# Patient Record
Sex: Male | Born: 1995 | Race: Black or African American | Hispanic: No | Marital: Single | Smoking: Current some day smoker
Health system: Southern US, Community
[De-identification: ages and names within clinical notes are randomized; demographics above are authoritative.]

---
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
## 2016-02-16 IMAGING — CR DG FOREARM 2V*L*
2 series · 2 of 2 positions shown · non-contrast
Comparison: None.

CLINICAL DATA: Pain post trauma

EXAM:
LEFT FOREARM - 2 VIEW

[x forearm ap left]
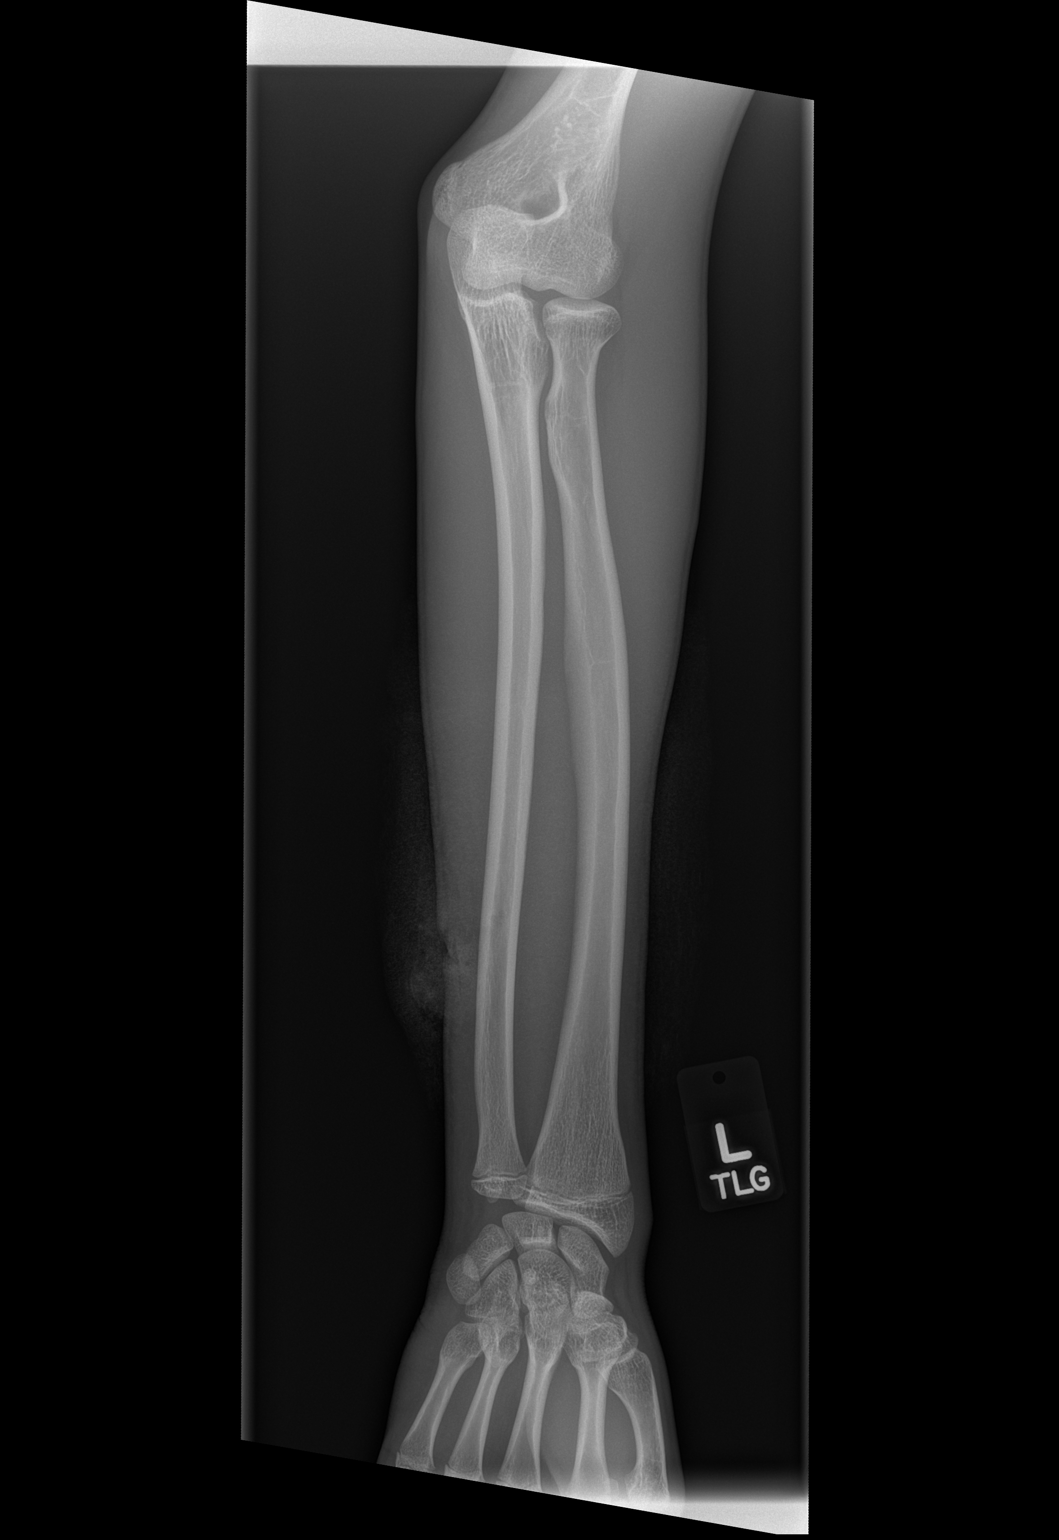

[x forearm lat left]
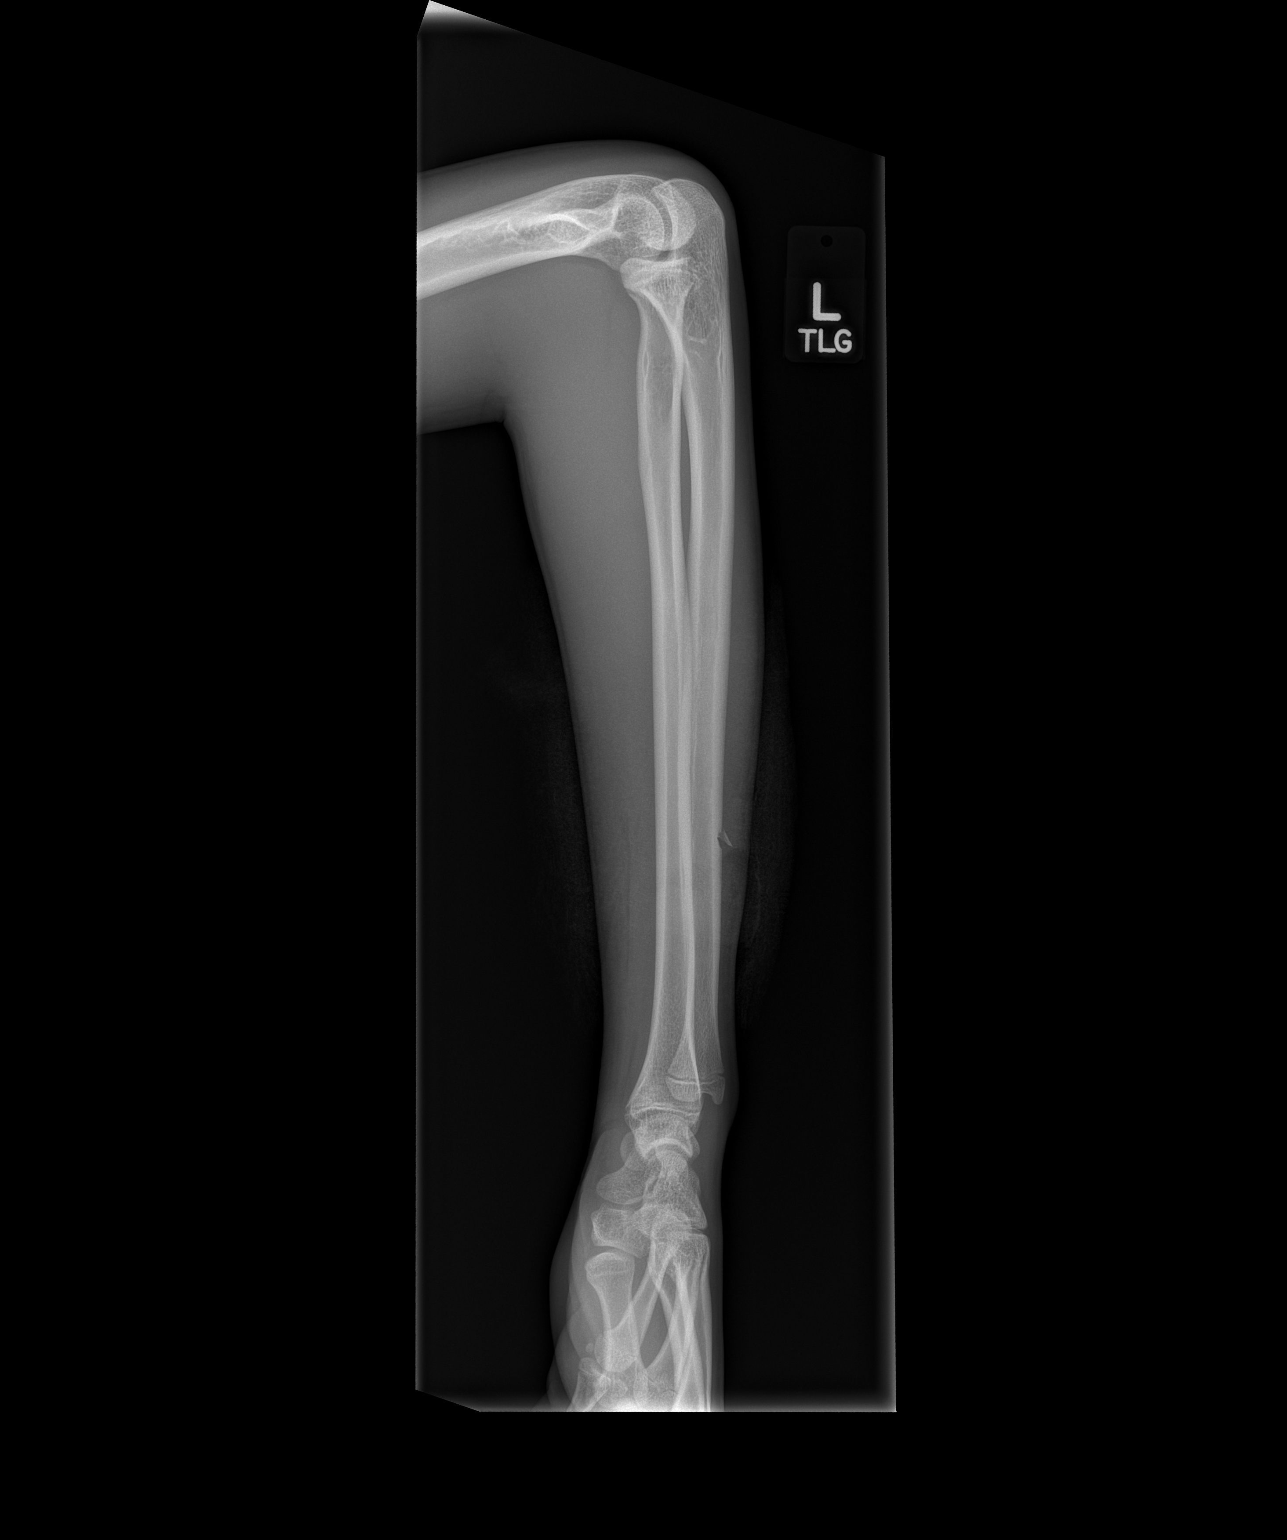

[2 of 2 positions shown; findings below may reference images not displayed]

FINDINGS: Frontal and lateral views were obtained. There is soft tissue injury
at the level of the distal ulnar diaphysis. There is a small
incomplete fracture along the dorsal aspect of the ulna at the
junction of the mid and distal thirds. No other fracture. No
dislocation. No radiopaque foreign body. Joint spaces appear intact.
IMPRESSION: Soft tissue injury over the distal ulna with an incomplete fracture
involving a portion of the cortex along the dorsal ulna near the
junction of the mid and distal thirds. No other fracture. No
dislocation.

## 2020-01-25 ENCOUNTER — Other Ambulatory Visit: Payer: Self-pay

## 2020-01-25 DIAGNOSIS — Z20822 Contact with and (suspected) exposure to covid-19: Secondary | ICD-10-CM

## 2020-01-26 LAB — SARS-COV-2, NAA 2 DAY TAT

## 2020-01-26 LAB — NOVEL CORONAVIRUS, NAA: SARS-CoV-2, NAA: NOT DETECTED

## 2021-04-04 ENCOUNTER — Ambulatory Visit (HOSPITAL_COMMUNITY)
Admission: EM | Admit: 2021-04-04 | Discharge: 2021-04-04 | Disposition: A | Discharge status: Home / Self Care | Payer: Self-pay

## 2021-04-04 ENCOUNTER — Other Ambulatory Visit: Payer: Self-pay

## 2021-04-04 DIAGNOSIS — M545 Low back pain, unspecified: Secondary | ICD-10-CM

## 2021-04-04 NOTE — ED Provider Notes (Signed)
[facility]    CSN: [id number] Arrival date & time: 04/04/21  1718      History   Chief Complaint Chief Complaint  Patient presents with   Work Note    HPI Ernest Alexander is a 25 y.o. male.   Pt reports he was experiencing lower back pain about two weeks ago.  Denies injury or trauma.  Denies radiation of pain, numbness, tingling.  He reports pain has completely resolved at this time.  He needs a note in order to return to work.  He is currently taking no medications.    No past medical history on file.  There are no problems to display for this patient.   No past surgical history on file.     Home Medications    Prior to Admission medications   Medication Sig Start Date End Date Taking? Authorizing Provider  cephALEXin (KEFLEX) 500 MG capsule Take 1 capsule (500 mg total) by mouth 4 (four) times daily. 12/29/13   Marcellina Millin, MD  ibuprofen (ADVIL,MOTRIN) 600 MG tablet Take 1 tablet (600 mg total) by mouth every 6 (six) hours as needed for mild pain. 12/29/13   Marcellina Millin, MD    Family History No family history on file.  Social History Social History   Tobacco Use   Smoking status: Some Days  Substance Use Topics   Alcohol use: Yes   Drug use: No    Types: Marijuana    Comment: past     Allergies   Patient has no known allergies.   Review of Systems Review of Systems  Constitutional:  Negative for chills and fever.  HENT:  Negative for ear pain and sore throat.   Eyes:  Negative for pain and visual disturbance.  Respiratory:  Negative for cough and shortness of breath.   Cardiovascular:  Negative for chest pain and palpitations.  Gastrointestinal:  Negative for abdominal pain and vomiting.  Genitourinary:  Negative for dysuria and hematuria.  Musculoskeletal:  Negative for arthralgias and back pain.  Skin:  Negative for color change and rash.  Neurological:  Negative for seizures and syncope.  All other systems reviewed and  are negative.   Physical Exam Triage Vital Signs ED Triage Vitals  Enc Vitals Group     BP 04/04/21 1821 121/80     Pulse Rate 04/04/21 1821 61     Resp 04/04/21 1821 18     Temp 04/04/21 1821 98 F (36.7 C)     Temp Source 04/04/21 1821 Oral     SpO2 04/04/21 1821 100 %     Weight --      Height --      Head Circumference --      Peak Flow --      Pain Score 04/04/21 1823 3     Pain Loc --      Pain Edu? --      Excl. in GC? --    No data found.  Updated Vital Signs BP 121/80 (BP Location: Left Arm)   Pulse 61   Temp 98 F (36.7 C) (Oral)   Resp 18   SpO2 100%   Visual Acuity Right Eye Distance:   Left Eye Distance:   Bilateral Distance:    Right Eye Near:   Left Eye Near:    Bilateral Near:     Physical Exam Vitals and nursing note reviewed.  Constitutional:      Appearance: He is well-developed.  HENT:  Head: Normocephalic and atraumatic.  Eyes:     Conjunctiva/sclera: Conjunctivae normal.  Cardiovascular:     Rate and Rhythm: Normal rate and regular rhythm.     Heart sounds: No murmur heard. Pulmonary:     Effort: Pulmonary effort is normal. No respiratory distress.     Breath sounds: Normal breath sounds.  Abdominal:     Palpations: Abdomen is soft.     Tenderness: There is no abdominal tenderness.  Musculoskeletal:     Cervical back: Neck supple.  Skin:    General: Skin is warm and dry.  Neurological:     Mental Status: He is alert.     UC Treatments / Results  Labs (all labs ordered are listed, but only abnormal results are displayed) Labs Reviewed - No data to display  EKG   Radiology No results found.  Procedures Procedures (including critical care time)  Medications Ordered in UC Medications - No data to display  Initial Impression / Assessment and Plan / UC Course  I have reviewed the triage vital signs and the nursing notes.  Pertinent labs & imaging results that were available during my care of the patient were  reviewed by me and considered in my medical decision making (see chart for details).     Back pain has resolved.  Pt is currently taking nothing for back pain.  He may return to work without restrictions. Return precautions discussed.  Final Clinical Impressions(s) / UC Diagnoses   Final diagnoses:  Acute low back pain without sciatica, unspecified back pain laterality     Discharge Instructions      May return to work without restrictions      ED Prescriptions   None    PDMP not reviewed this encounter.   Ward, Tylene Fantasia, PA-C 04/04/21 1849

## 2021-04-04 NOTE — ED Triage Notes (Signed)
Pt presents for note to return to work after being out for back pain.

## 2021-04-04 NOTE — Discharge Instructions (Signed)
May return to work without restrictions.

## 2021-05-05 ENCOUNTER — Encounter (HOSPITAL_COMMUNITY): Payer: Self-pay | Admitting: Emergency Medicine

## 2021-05-05 ENCOUNTER — Other Ambulatory Visit: Payer: Self-pay

## 2021-05-05 ENCOUNTER — Emergency Department (HOSPITAL_COMMUNITY)
Admission: EM | Admit: 2021-05-05 | Discharge: 2021-05-06 | Disposition: A | Discharge status: Home / Self Care | Payer: Self-pay | Attending: Emergency Medicine | Admitting: Emergency Medicine

## 2021-05-05 DIAGNOSIS — Z5321 Procedure and treatment not carried out due to patient leaving prior to being seen by health care provider: Secondary | ICD-10-CM | POA: Insufficient documentation

## 2021-05-05 DIAGNOSIS — R300 Dysuria: Secondary | ICD-10-CM | POA: Insufficient documentation

## 2021-05-05 LAB — URINALYSIS, ROUTINE W REFLEX MICROSCOPIC
Bacteria, UA: NONE SEEN
Bilirubin Urine: NEGATIVE
Glucose, UA: NEGATIVE mg/dL
Hgb urine dipstick: NEGATIVE
Ketones, ur: NEGATIVE mg/dL
Nitrite: NEGATIVE
Protein, ur: NEGATIVE mg/dL
Specific Gravity, Urine: 1.017 (ref 1.005–1.030)
WBC, UA: >50 WBC/hpf — ABNORMAL HIGH (ref 0–5)
pH: 7 (ref 5.0–8.0)

## 2021-05-05 LAB — HIV ANTIBODY (ROUTINE TESTING W REFLEX): HIV Screen 4th Generation wRfx: NONREACTIVE

## 2021-05-05 NOTE — ED Triage Notes (Addendum)
Patient requesting STD screening , reports dysuria this week and unprotected sexual encounter recently .

## 2021-05-05 NOTE — ED Notes (Signed)
PT has been called X4 for vitals . No answer. Will try again before removing  off floor

## 2021-05-05 NOTE — ED Provider Notes (Signed)
Emergency Medicine Provider Triage Evaluation Note  Ernest Alexander , a 25 y.o. male  was evaluated in triage.  Pt complains of dysuria.  Had 3 episodes of dysuria earlier today.  Patient reports that he is sexually active in a mutually monogamous relationship with a male partner, does not use condoms for penetrative vaginal sex.  Review of Systems  Positive: Dysuria Negative: Hematuria, urinary urgency, penile discharge, swelling or tenderness to genitals, genital sores or lesions, abdominal pain, nausea, vomiting, fever, chills  Physical Exam  BP 134/79 (BP Location: Left Arm)   Pulse 67   Temp 99.2 F (37.3 C) (Oral)   Resp 16   Ht 6\' 5"  (1.956 m)   Wt 95 kg   SpO2 100%   BMI 24.84 kg/m  Gen:   Awake, no distress   Resp:  Normal effort  MSK:   Moves extremities without difficulty  Other:  Abdomen soft, nondistended, nontender  Medical Decision Making  Medically screening exam initiated at 7:18 PM.  Appropriate orders placed.  Ernest Alexander was informed that the remainder of the evaluation will be completed by another provider, this initial triage assessment does not replace that evaluation, and the importance of remaining in the ED until their evaluation is complete.     Michaelene Song, PA-C 05/05/21 1919    05/07/21, MD 05/06/21 1051

## 2021-05-06 LAB — RPR: RPR Ser Ql: NONREACTIVE

## 2021-05-07 ENCOUNTER — Other Ambulatory Visit: Payer: Self-pay

## 2021-05-07 ENCOUNTER — Encounter (HOSPITAL_COMMUNITY): Payer: Self-pay

## 2021-05-07 ENCOUNTER — Ambulatory Visit (HOSPITAL_COMMUNITY)
Admission: EM | Admit: 2021-05-07 | Discharge: 2021-05-07 | Disposition: A | Discharge status: Home / Self Care | Payer: Self-pay | Attending: Emergency Medicine | Admitting: Emergency Medicine

## 2021-05-07 DIAGNOSIS — A549 Gonococcal infection, unspecified: Secondary | ICD-10-CM

## 2021-05-07 LAB — URINE CULTURE: Culture: <10000 — AB

## 2021-05-07 LAB — GC/CHLAMYDIA PROBE AMP (~~LOC~~) NOT AT ARMC
Chlamydia: NEGATIVE
Comment: NEGATIVE
Comment: NORMAL
Neisseria Gonorrhea: POSITIVE — AB

## 2021-05-07 MED ORDER — CEFTRIAXONE SODIUM 500 MG IJ SOLR
INTRAMUSCULAR | Status: AC
  Filled 2021-05-07: qty 500

## 2021-05-07 MED ORDER — CEFTRIAXONE SODIUM 500 MG IJ SOLR
500.0000 mg | Freq: Once | INTRAMUSCULAR | Status: AC
  Administered 2021-05-07: 500 mg via INTRAMUSCULAR

## 2021-05-07 MED ORDER — LIDOCAINE HCL (PF) 1 % IJ SOLN
INTRAMUSCULAR | Status: AC
  Filled 2021-05-07: qty 2

## 2021-05-07 NOTE — ED Triage Notes (Signed)
Pt presents with concerns of STD exposure. States he was tested at the hospital and states he has a burning feeling when urinating.

## 2021-05-07 NOTE — Discharge Instructions (Addendum)
Please refrain from having sex for at least 1 week so that we can ensure that your infection has cleared  You may return to urgent care or [facility] department for reevaluation in 2 to 4 weeks  Moving forward please use condoms to prevent further transmission of any infection

## 2021-05-07 NOTE — ED Provider Notes (Signed)
[place]    CSN: [id number] Arrival date & time: 05/07/21  1715      History   Chief Complaint Chief Complaint  Patient presents with   SEXUALLY TRANSMITTED DISEASE    HPI Ernest Alexander is a 25 y.o. male.   Presents with penile discharge described as clear for 1 day.  Endorses dysuria three times, three days ago, which has not resolved.  Was seen in emergency department 3 days ago, positive for gonorrhea, did not receive treatment due to leaving early.  Denies abdominal pain, flank pain, urinary frequency or urgency, new rash or lesions, hematuria, penile or testicle swelling.  Sexually active, 1 partner, no condom use.  History reviewed. No pertinent past medical history.  There are no problems to display for this patient.   History reviewed. No pertinent surgical history.     Home Medications    Prior to Admission medications   Medication Sig Start Date End Date Taking? Authorizing Provider  cephALEXin (KEFLEX) 500 MG capsule Take 1 capsule (500 mg total) by mouth 4 (four) times daily. 12/29/13   Isaac Bliss, MD  ibuprofen (ADVIL,MOTRIN) 600 MG tablet Take 1 tablet (600 mg total) by mouth every 6 (six) hours as needed for mild pain. 12/29/13   Isaac Bliss, MD    Family History History reviewed. No pertinent family history.  Social History Social History   Tobacco Use   Smoking status: Some Days  Vaping Use   Vaping Use: Some days  Substance Use Topics   Alcohol use: Yes   Drug use: Yes    Types: Marijuana    Comment: past     Allergies   Patient has no known allergies.   Review of Systems Review of Systems  Constitutional: Negative.   Respiratory: Negative.    Cardiovascular: Negative.   Genitourinary:  Positive for dysuria and penile discharge. Negative for decreased urine volume, difficulty urinating, enuresis, flank pain, frequency, genital sores, hematuria, penile pain, penile swelling, scrotal swelling, testicular pain and  urgency.  Skin: Negative.   Neurological: Negative.     Physical Exam Triage Vital Signs ED Triage Vitals  Enc Vitals Group     BP 05/07/21 1817 130/72     Pulse Rate 05/07/21 1817 64     Resp 05/07/21 1817 17     Temp 05/07/21 1817 99 F (37.2 C)     Temp Source 05/07/21 1817 Oral     SpO2 05/07/21 1817 98 %     Weight --      Height --      Head Circumference --      Peak Flow --      Pain Score 05/07/21 1816 4     Pain Loc --      Pain Edu? --      Excl. in [place]? --    No data found.  Updated Vital Signs BP 130/72 (BP Location: Left Arm)   Pulse 64   Temp 99 F (37.2 C) (Oral)   Resp 17   SpO2 98%   Visual Acuity Right Eye Distance:   Left Eye Distance:   Bilateral Distance:    Right Eye Near:   Left Eye Near:    Bilateral Near:     Physical Exam Constitutional:      Appearance: Normal appearance. He is normal weight.  HENT:     Head: Normocephalic.  Eyes:     Extraocular Movements: Extraocular movements intact.  Pulmonary:  Effort: Pulmonary effort is normal.  Genitourinary:    Comments: Deferred Skin:    General: Skin is warm and dry.  Neurological:     Mental Status: He is alert and oriented to person, place, and time. Mental status is at baseline.  Psychiatric:        Mood and Affect: Mood normal.        Behavior: Behavior normal.     UC Treatments / Results  Labs (all labs ordered are listed, but only abnormal results are displayed) Labs Reviewed - No data to display  EKG   Radiology No results found.  Procedures Procedures (including critical care time)  Medications Ordered in UC Medications  cefTRIAXone (ROCEPHIN) injection 500 mg (has no administration in time range)    Initial Impression / Assessment and Plan / UC Course  I have reviewed the triage vital signs and the nursing notes.  Pertinent labs & imaging results that were available during my care of the patient were reviewed by me and considered in my medical  decision making (see chart for details).  Gonorrhea  Reviewed lab work from emergency department visit on 05/05/2021, will not repeat  testing today, ceftriaxone 500 mg IM now, advised abstinence for at least 7 days and until all symptoms resolve, advised condom use moving forward Final Clinical Impressions(s) / UC Diagnoses   Final diagnoses:  None   Discharge Instructions   None    ED Prescriptions   None    PDMP not reviewed this encounter.   Valinda Hoar, NP 05/07/21 1850

## 2021-06-13 ENCOUNTER — Ambulatory Visit (HOSPITAL_COMMUNITY)
Admission: EM | Admit: 2021-06-13 | Discharge: 2021-06-13 | Disposition: A | Discharge status: Home / Self Care | Payer: Self-pay | Attending: Internal Medicine | Admitting: Internal Medicine

## 2021-06-13 ENCOUNTER — Other Ambulatory Visit: Payer: Self-pay

## 2021-06-13 ENCOUNTER — Encounter (HOSPITAL_COMMUNITY): Payer: Self-pay | Admitting: *Deleted

## 2021-06-13 DIAGNOSIS — Z113 Encounter for screening for infections with a predominantly sexual mode of transmission: Secondary | ICD-10-CM | POA: Insufficient documentation

## 2021-06-13 DIAGNOSIS — Z202 Contact with and (suspected) exposure to infections with a predominantly sexual mode of transmission: Secondary | ICD-10-CM

## 2021-06-13 NOTE — ED Provider Notes (Signed)
[facility]    CSN: [id number] Arrival date & time: 06/13/21  0934      History   Chief Complaint Chief Complaint  Patient presents with   SEXUALLY TRANSMITTED DISEASE    HPI Ernest Alexander is a 25 y.o. male comes to urgent care for STD screening.  Patient has no symptoms.  Patient was treated for gonorrhea in the end of the year.  He has not changed sexual partners and continues to engage in unprotected sexual intercourse.Marland Kitchen   HPI  History reviewed. No pertinent past medical history.  There are no problems to display for this patient.   History reviewed. No pertinent surgical history.     Home Medications    Prior to Admission medications   Not on File    Family History History reviewed. No pertinent family history.  Social History Social History   Tobacco Use   Smoking status: Some Days  Vaping Use   Vaping Use: Some days  Substance Use Topics   Alcohol use: Yes   Drug use: Yes    Types: Marijuana    Comment: past     Allergies   Patient has no known allergies.   Review of Systems Review of Systems  All other systems reviewed and are negative.   Physical Exam Triage Vital Signs ED Triage Vitals  Enc Vitals Group     BP 06/13/21 1054 135/73     Pulse Rate 06/13/21 1054 (!) 51     Resp 06/13/21 1054 18     Temp 06/13/21 1054 98.2 F (36.8 C)     Temp src --      SpO2 06/13/21 1054 100 %     Weight --      Height --      Head Circumference --      Peak Flow --      Pain Score 06/13/21 1052 0     Pain Loc --      Pain Edu? --      Excl. in [place]? --    No data found.  Updated Vital Signs BP 135/73    Pulse (!) 51    Temp 98.2 F (36.8 C)    Resp 18    SpO2 100%   Visual Acuity Right Eye Distance:   Left Eye Distance:   Bilateral Distance:    Right Eye Near:   Left Eye Near:    Bilateral Near:     Physical Exam Vitals and nursing note reviewed.  Constitutional:      General: He is not in acute distress.     Appearance: He is not ill-appearing.  Cardiovascular:     Rate and Rhythm: Normal rate and regular rhythm.     Pulses: Normal pulses.     Heart sounds: Normal heart sounds.  Pulmonary:     Effort: Pulmonary effort is normal.     Breath sounds: Normal breath sounds.  Abdominal:     General: Bowel sounds are normal.     Palpations: Abdomen is soft.  Neurological:     Mental Status: He is alert.     UC Treatments / Results  Labs (all labs ordered are listed, but only abnormal results are displayed) Labs Reviewed  CYTOLOGY, (ORAL, ANAL, URETHRAL) ANCILLARY ONLY    EKG   Radiology No results found.  Procedures Procedures (including critical care time)  Medications Ordered in UC Medications - No data to display  Initial Impression / Assessment and Plan /  UC Course  I have reviewed the triage vital signs and the nursing notes.  Pertinent labs & imaging results that were available during my care of the patient were reviewed by me and considered in my medical decision making (see chart for details).     1.  STD screening: Cytology for GC/chlamydia/trichomonas Patient refused HIV/RPR testing Safe sex practices advised. We will call patient with recommendations if labs are abnormal. Final Clinical Impressions(s) / UC Diagnoses   Final diagnoses:  Screen for STD (sexually transmitted disease)     Discharge Instructions      Safe sex practices recommended We will call you with recommendations if labs are abnormal Return to urgent care if you have any further concerns.     ED Prescriptions   None    PDMP not reviewed this encounter.   Merrilee Jansky, MD 06/13/21 [phone]

## 2021-06-13 NOTE — Discharge Instructions (Signed)
Safe sex practices recommended We will call you with recommendations if labs are abnormal Return to urgent care if you have any further concerns.

## 2021-06-13 NOTE — ED Triage Notes (Signed)
Pt wants STD check  with out Sx's.

## 2021-06-14 LAB — CYTOLOGY, (ORAL, ANAL, URETHRAL) ANCILLARY ONLY
Chlamydia: NEGATIVE
Comment: NEGATIVE
Comment: NEGATIVE
Comment: NORMAL
Neisseria Gonorrhea: NEGATIVE
Trichomonas: NEGATIVE

## 2021-11-24 ENCOUNTER — Ambulatory Visit (HOSPITAL_COMMUNITY)
Admission: EM | Admit: 2021-11-24 | Discharge: 2021-11-24 | Disposition: A | Discharge status: Home / Self Care | Payer: Self-pay | Attending: Emergency Medicine | Admitting: Emergency Medicine

## 2021-11-24 ENCOUNTER — Encounter (HOSPITAL_COMMUNITY): Payer: Self-pay | Admitting: Emergency Medicine

## 2021-11-24 DIAGNOSIS — Z113 Encounter for screening for infections with a predominantly sexual mode of transmission: Secondary | ICD-10-CM | POA: Insufficient documentation

## 2021-11-24 DIAGNOSIS — Z202 Contact with and (suspected) exposure to infections with a predominantly sexual mode of transmission: Secondary | ICD-10-CM

## 2021-11-24 LAB — HIV ANTIBODY (ROUTINE TESTING W REFLEX): HIV Screen 4th Generation wRfx: NONREACTIVE

## 2021-11-24 NOTE — ED Provider Notes (Signed)
[facility]    CSN: [id number] Arrival date & time: 11/24/21  1037      History   Chief Complaint Chief Complaint  Patient presents with   SEXUALLY TRANSMITTED DISEASE    HPI Ernest Alexander is a 26 y.o. male.   Patient presents requesting STI testing.  Denies any symptoms.  Sexually active, 2 male partners within the last 3 months, sometimes condom use.  No known exposure.   History reviewed. No pertinent past medical history.  There are no problems to display for this patient.   History reviewed. No pertinent surgical history.     Home Medications    Prior to Admission medications   Not on File    Family History History reviewed. No pertinent family history.  Social History Social History   Tobacco Use   Smoking status: Some Days  Vaping Use   Vaping Use: Some days  Substance Use Topics   Alcohol use: Yes   Drug use: Yes    Types: Marijuana    Comment: past     Allergies   Patient has no known allergies.   Review of Systems Review of Systems  Constitutional: Negative.   Genitourinary: Negative.   Skin: Negative.      Physical Exam Triage Vital Signs ED Triage Vitals [11/24/21 1111]  Enc Vitals Group     BP 129/80     Pulse Rate 72     Resp 16     Temp 98.7 F (37.1 C)     Temp Source Oral     SpO2 98 %     Weight      Height      Head Circumference      Peak Flow      Pain Score 0     Pain Loc      Pain Edu?      Excl. in [place]?    No data found.  Updated Vital Signs BP 129/80 (BP Location: Left Arm)   Pulse 72   Temp 98.7 F (37.1 C) (Oral)   Resp 16   SpO2 98%   Visual Acuity Right Eye Distance:   Left Eye Distance:   Bilateral Distance:    Right Eye Near:   Left Eye Near:    Bilateral Near:     Physical Exam Constitutional:      Appearance: Normal appearance.  Pulmonary:     Effort: Pulmonary effort is normal.  Genitourinary:    Comments: deferred Neurological:     Mental Status: He is  alert and oriented to person, place, and time. Mental status is at baseline.  Psychiatric:        Mood and Affect: Mood normal.        Behavior: Behavior normal.      UC Treatments / Results  Labs (all labs ordered are listed, but only abnormal results are displayed) Labs Reviewed  HIV ANTIBODY (ROUTINE TESTING W REFLEX)  RPR  CYTOLOGY, (ORAL, ANAL, URETHRAL) ANCILLARY ONLY    EKG   Radiology No results found.  Procedures Procedures (including critical care time)  Medications Ordered in UC Medications - No data to display  Initial Impression / Assessment and Plan / UC Course  I have reviewed the triage vital signs and the nursing notes.  Pertinent labs & imaging results that were available during my care of the patient were reviewed by me and considered in my medical decision making (see chart for details).  Routine screening for STI  STI labs pending, will treat per protocol, advised abstinence until all lab results and/or treatment is complete, advised condom use during all sexual encounters moving forward, may follow-up with urgent care as Final Clinical Impressions(s) / UC Diagnoses   Final diagnoses:  Routine screening for STI (sexually transmitted infection)     Discharge Instructions      Labs pending 2-3 days, you will be contacted if positive for any sti and treatment will be sent to the pharmacy, you will have to return to the clinic if positive for gonorrhea to receive treatment   Please refrain from having sex until labs results, if positive please refrain from having sex until treatment complete and symptoms resolve   If positive for HIV, Syphilis, Chlamydia  gonorrhea or trichomoniasis please notify partner or partners so they may tested as well  Moving forward, it is recommended you use some form of protection against the transmission of sti infections  such as condoms or dental dams with each sexual encounter     ED Prescriptions   None     PDMP not reviewed this encounter.   Valinda Hoar, NP 11/24/21 1142

## 2021-11-24 NOTE — Discharge Instructions (Signed)
Labs pending 2-3 days, you will be contacted if positive for any sti and treatment will be sent to the pharmacy, you will have to return to the clinic if positive for gonorrhea to receive treatment   Please refrain from having sex until labs results, if positive please refrain from having sex until treatment complete and symptoms resolve   If positive for HIV, Syphilis, Chlamydia  gonorrhea or trichomoniasis please notify partner or partners so they may tested as well  Moving forward, it is recommended you use some form of protection against the transmission of sti infections  such as condoms or dental dams with each sexual encounter   

## 2021-11-24 NOTE — ED Triage Notes (Signed)
Requesting STD test w/ bloodwork. No symptoms, no known exposure

## 2021-11-25 LAB — SYPHILIS: RPR W/REFLEX TO RPR TITER AND TREPONEMAL ANTIBODIES, TRADITIONAL SCREENING AND DIAGNOSIS ALGORITHM: RPR Ser Ql: NONREACTIVE

## 2021-11-26 LAB — CYTOLOGY, (ORAL, ANAL, URETHRAL) ANCILLARY ONLY
Chlamydia: NEGATIVE
Comment: NEGATIVE
Comment: NEGATIVE
Comment: NORMAL
Neisseria Gonorrhea: NEGATIVE
Trichomonas: NEGATIVE
